# Patient Record
Sex: Female | Born: 1975 | Race: White | Hispanic: No | Marital: Married | Smoking: Never smoker
Health system: Southern US, Community
[De-identification: ages and names within clinical notes are randomized; demographics above are authoritative.]

## PROBLEM LIST (undated history)

## (undated) DIAGNOSIS — T7840XA Allergy, unspecified, initial encounter: Secondary | ICD-10-CM

## (undated) HISTORY — DX: Allergy, unspecified, initial encounter: T78.40XA

## (undated) HISTORY — PX: KNEE SURGERY: SHX244

---

## 2013-03-28 ENCOUNTER — Emergency Department: Payer: Self-pay | Admitting: Emergency Medicine

## 2013-03-28 LAB — URINALYSIS, COMPLETE
Blood: NEGATIVE
Ph: 5 (ref 4.5–8.0)
Protein: NEGATIVE
RBC,UR: 1 /HPF (ref 0–5)
Squamous Epithelial: <1

## 2013-03-28 LAB — COMPREHENSIVE METABOLIC PANEL
Alkaline Phosphatase: 75 U/L (ref 50–136)
BUN: 15 mg/dL (ref 7–18)
Bilirubin,Total: 0.2 mg/dL (ref 0.2–1.0)
Calcium, Total: 9 mg/dL (ref 8.5–10.1)
Chloride: 108 mmol/L — ABNORMAL HIGH (ref 98–107)
Co2: 26 mmol/L (ref 21–32)
Glucose: 87 mg/dL (ref 65–99)
Potassium: 3.5 mmol/L (ref 3.5–5.1)
SGPT (ALT): 32 U/L (ref 12–78)
Sodium: 139 mmol/L (ref 136–145)

## 2013-03-28 LAB — CBC
HCT: 39.7 % (ref 35.0–47.0)
HGB: 13.5 g/dL (ref 12.0–16.0)
MCHC: 34.1 g/dL (ref 32.0–36.0)
MCV: 85 fL (ref 80–100)
Platelet: 277 10*3/uL (ref 150–440)
RBC: 4.68 10*6/uL (ref 3.80–5.20)
WBC: 8.4 10*3/uL (ref 3.6–11.0)

## 2013-03-28 LAB — LIPASE, BLOOD: Lipase: 285 U/L (ref 73–393)

## 2013-04-11 ENCOUNTER — Ambulatory Visit: Payer: Self-pay | Admitting: Gastroenterology

## 2013-04-11 HISTORY — PX: COLONOSCOPY: SHX174

## 2015-01-28 DIAGNOSIS — N23 Unspecified renal colic: Secondary | ICD-10-CM | POA: Insufficient documentation

## 2015-01-28 DIAGNOSIS — Z841 Family history of disorders of kidney and ureter: Secondary | ICD-10-CM | POA: Insufficient documentation

## 2015-01-28 DIAGNOSIS — R310 Gross hematuria: Secondary | ICD-10-CM | POA: Insufficient documentation

## 2017-11-23 ENCOUNTER — Encounter: Payer: Self-pay | Admitting: *Deleted

## 2017-12-19 ENCOUNTER — Ambulatory Visit (INDEPENDENT_AMBULATORY_CARE_PROVIDER_SITE_OTHER): Payer: BLUE CROSS/BLUE SHIELD | Admitting: General Surgery

## 2017-12-19 ENCOUNTER — Encounter: Payer: Self-pay | Admitting: General Surgery

## 2017-12-19 VITALS — BP 124/82 | HR 87 | Resp 18 | Ht 64.0 in | Wt 118.0 lb

## 2017-12-19 DIAGNOSIS — Z800 Family history of malignant neoplasm of digestive organs: Secondary | ICD-10-CM | POA: Diagnosis not present

## 2017-12-19 DIAGNOSIS — Z1211 Encounter for screening for malignant neoplasm of colon: Secondary | ICD-10-CM | POA: Diagnosis not present

## 2017-12-19 MED ORDER — POLYETHYLENE GLYCOL 3350 17 GM/SCOOP PO POWD: 1.0000 | Freq: Once | ORAL | 0 refills | Status: AC

## 2017-12-19 NOTE — Patient Instructions (Addendum)
Colonoscopy, Adult A colonoscopy is an exam to look at the entire large intestine. During the exam, a lubricated, bendable tube is inserted into the anus and then passed into the rectum, colon, and other parts of the large intestine. A colonoscopy is often done as a part of normal colorectal screening or in response to certain symptoms, such as anemia, persistent diarrhea, abdominal pain, and blood in the stool. The exam can help screen for and diagnose medical problems, including:  Tumors.  Polyps.  Inflammation.  Areas of bleeding.  Tell a health care provider about:  Any allergies you have.  All medicines you are taking, including vitamins, herbs, eye drops, creams, and over-the-counter medicines.  Any problems you or family members have had with anesthetic medicines.  Any blood disorders you have.  Any surgeries you have had.  Any medical conditions you have.  Any problems you have had passing stool. What are the risks? Generally, this is a safe procedure. However, problems may occur, including:  Bleeding.  A tear in the intestine.  A reaction to medicines given during the exam.  Infection (rare).  What happens before the procedure? Eating and drinking restrictions Follow instructions from your health care provider about eating and drinking, which may include:  A few days before the procedure - follow a low-fiber diet. Avoid nuts, seeds, dried fruit, raw fruits, and vegetables.  1-3 days before the procedure - follow a clear liquid diet. Drink only clear liquids, such as clear broth or bouillon, black coffee or tea, clear juice, clear soft drinks or sports drinks, gelatin dessert, and popsicles. Avoid any liquids that contain red or purple dye.  On the day of the procedure - do not eat or drink anything during the 2 hours before the procedure, or within the time period that your health care provider recommends.  Bowel prep If you were prescribed an oral bowel prep  to clean out your colon:  Take it as told by your health care provider. Starting the day before your procedure, you will need to drink a large amount of medicated liquid. The liquid will cause you to have multiple loose stools until your stool is almost clear or light green.  If your skin or anus gets irritated from diarrhea, you may use these to relieve the irritation: ? Medicated wipes, such as adult wet wipes with aloe and vitamin E. ? A skin soothing-product like petroleum jelly.  If you vomit while drinking the bowel prep, take a break for up to 60 minutes and then begin the bowel prep again. If vomiting continues and you cannot take the bowel prep without vomiting, call your health care provider.  General instructions  Ask your health care provider about changing or stopping your regular medicines. This is especially important if you are taking diabetes medicines or blood thinners.  Plan to have someone take you home from the hospital or clinic. What happens during the procedure?  An IV tube may be inserted into one of your veins.  You will be given medicine to help you relax (sedative).  To reduce your risk of infection: ? Your health care team will wash or sanitize their hands. ? Your anal area will be washed with soap.  You will be asked to lie on your side with your knees bent.  Your health care provider will lubricate a long, thin, flexible tube. The tube will have a camera and a light on the end.  The tube will be inserted into your   anus.  The tube will be gently eased through your rectum and colon.  Air will be delivered into your colon to keep it open. You may feel some pressure or cramping.  The camera will be used to take images during the procedure.  A small tissue sample may be removed from your body to be examined under a microscope (biopsy). If any potential problems are found, the tissue will be sent to a lab for testing.  If small polyps are found, your  health care provider may remove them and have them checked for cancer cells.  The tube that was inserted into your anus will be slowly removed. The procedure may vary among health care providers and hospitals. What happens after the procedure?  Your blood pressure, heart rate, breathing rate, and blood oxygen level will be monitored until the medicines you were given have worn off.  Do not drive for 24 hours after the exam.  You may have a small amount of blood in your stool.  You may pass gas and have mild abdominal cramping or bloating due to the air that was used to inflate your colon during the exam.  It is up to you to get the results of your procedure. Ask your health care provider, or the department performing the procedure, when your results will be ready. This information is not intended to replace advice given to you by your health care provider. Make sure you discuss any questions you have with your health care provider. Document Released: 08/26/2000 Document Revised: 06/29/2016 Document Reviewed: 11/10/2015 Elsevier Interactive Patient Education  [employer]2018 Elsevier [employer].  The patient is scheduled for a Colonoscopy at [facility] on 02/28/18. They are aware to call the day before to get their arrival time. Miralax prescription has been sent into the patient's pharmacy. The patient is aware of date and instructions.

## 2017-12-19 NOTE — Progress Notes (Signed)
Patient ID: Joanne Delgado, female   DOB: 10/27/1975, 42 y.o.   MRN: [id number]  Chief Complaint  Patient presents with  . Colonoscopy    HPI Joanne Delgado is a 42 y.o. female.  Who presents for a colonoscopy discussion. The last colonoscopy was completed on 2014 by Dr Shelle Iron. Denies any gastrointestinal issues. Bowels move regular and no bleeding noted. She states that she notices a sensitivity to grease causing indigestion. Since she has cut back on intake her symptoms have improved.  She does admit to an awareness in the left upper abdomen/lateral side over the past year. She states her platelets are high and she is concerned. She is concerned about a hiatal hernia as well. CT scan was 2016. 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  HPI  Past Medical History:  Diagnosis Date  . Allergy     Past Surgical History:  Procedure Laterality Date  . CESAREAN SECTION  2014   Twins  . COLONOSCOPY  04/11/2013   Dr Shelle Iron  . KNEE SURGERY Right     Family History  Problem Relation Age of Onset  . Colon cancer Mother 71       [employer] [place]    Social History Social History   Tobacco Use  . Smoking status: Former Smoker    Years: 4.00    Types: Cigarettes  . Smokeless tobacco: Never Used  Substance Use Topics  . Alcohol use: Not Currently  . Drug use: Not Currently    Allergies  Allergen Reactions  . Codeine Nausea And Vomiting    Current Outpatient Medications  Medication Sig Dispense Refill  . norethindrone-ethinyl estradiol 1/35 (ALAYCEN 1/35) tablet TAKE 1 TABLET BY MOUTH EVERY DAY *NO PLACEBO PILLS*    . spironolactone (ALDACTONE) 25 MG tablet Take 25 mg by mouth daily.      No current facility-administered medications for this visit.     Review of Systems Review of Systems  Constitutional: Negative.   Respiratory: Negative.   Cardiovascular: Negative.   Gastrointestinal: Negative for anal bleeding, constipation and diarrhea.    Blood pressure 124/82,  pulse 87, resp. rate 18, height 5\' 4"  (1.626 m), weight 118 lb (53.5 kg), SpO2 99 %.  Physical Exam Physical Exam  Constitutional: She is oriented to person, place, and time. She appears well-developed and well-nourished.  HENT:  Mouth/Throat: Oropharynx is clear and moist.  Eyes: Conjunctivae are normal. No scleral icterus.  Neck: Neck supple.  Cardiovascular: Normal rate, regular rhythm and normal heart sounds.  Pulmonary/Chest: Effort normal and breath sounds normal.  Abdominal: Soft. Normal appearance and bowel sounds are normal. There is no tenderness.  Lymphadenopathy:    She has no cervical adenopathy.  Neurological: She is alert and oriented to person, place, and time.  Skin: Skin is warm and dry.  Psychiatric: Her behavior is normal.    Data Reviewed Upper endoscopy report of April 11, 2013 completed for abdominal pain was reviewed.  Normal esophagus, stomach and duodenum.  Colonoscopy of the same date completed for a family history of colon cancer first-degree relative was reviewed.  Normal colon.  Repeat in 5 years recommended based on family history.  September 25, 2017 exam with the Margit Banda medical aide, MD from [facility] health at [place] reviewed.  Laboratory studies from that visit were reviewed.  Hemoglobin of 13.1 with an MCV of 92, white blood cell count of 9000.  Platelet count elevated at 432,000, ([facility] normal 150-379,000). Comprehensive metabolic  panel was entirely normal.  Creatinine 0.69.  Estimated GFR 108 electrolytes.  Normal.  Assessment    Candidate for screening colonoscopy based on family history (mother at age 43).    Plan    Plan to complete the procedure and sometime after July to meet the patient's travel schedule.  The minimal elevation of the serum platelet count is not of any concern that I am aware of.  We discussed the finding of hiatal hernia and a half of adults.  She has no dysphagia or significant reflux symptoms.  Negative EGD 5 years  ago.  No indication for repeat at this time.  Colonoscopy with possible biopsy/polypectomy prn: Information regarding the procedure, including its potential risks and complications (including but not limited to perforation of the bowel, which may require emergency surgery to repair, and bleeding) was verbally given to the patient. Educational information regarding lower intestinal endoscopy was given to the patient. Written instructions for how to complete the bowel prep using Miralax were provided. The importance of drinking ample fluids to avoid dehydration as a result of the prep emphasized.    HPI, Physical Exam, Assessment and Plan have been scribed under the direction and in the presence of Earline MayotteJeffrey W. Anupama Piehl, MD. Dorathy DaftMarsha Hatch, RN  The patient is scheduled [facility] a [facility]. They are aware to call the day before to get their arrival time. Miralax prescription has been sent into the patient's pharmacy. The patient is aware of date and instructions.  Documented by Caryl-Lyn Louanna RawM Kennedy LPN  Merrily PewJeffrey W Demitrios Molyneux 12/20/2017, 5:46 PM

## 2017-12-20 DIAGNOSIS — Z1211 Encounter for screening for malignant neoplasm of colon: Secondary | ICD-10-CM | POA: Insufficient documentation

## 2017-12-20 DIAGNOSIS — Z800 Family history of malignant neoplasm of digestive organs: Secondary | ICD-10-CM | POA: Insufficient documentation

## 2018-02-04 ENCOUNTER — Other Ambulatory Visit: Payer: Self-pay | Admitting: General Surgery

## 2018-02-04 DIAGNOSIS — Z800 Family history of malignant neoplasm of digestive organs: Secondary | ICD-10-CM

## 2018-02-22 ENCOUNTER — Telehealth: Payer: Self-pay | Admitting: General Surgery

## 2018-02-22 NOTE — Telephone Encounter (Signed)
Patient called and is scheduled for a colonoscopy on Wednesday 02-28-18 & needs someone to go over directions with her,she has lost her's. She did pick up the prep. Call [phone].

## 2018-02-22 NOTE — Telephone Encounter (Signed)
Instructions reviewed with patient

## 2018-02-28 ENCOUNTER — Encounter
Admission: RE | Disposition: A | Discharge status: Home / Self Care | Payer: Self-pay | Source: Ambulatory Visit | Attending: General Surgery

## 2018-02-28 ENCOUNTER — Encounter: Payer: Self-pay | Admitting: Anesthesiology

## 2018-02-28 ENCOUNTER — Ambulatory Visit: Payer: BLUE CROSS/BLUE SHIELD | Admitting: Anesthesiology

## 2018-02-28 ENCOUNTER — Other Ambulatory Visit: Payer: Self-pay

## 2018-02-28 ENCOUNTER — Ambulatory Visit
Admission: RE | Admit: 2018-02-28 | Discharge: 2018-02-28 | Disposition: A | Discharge status: Home / Self Care | Payer: BLUE CROSS/BLUE SHIELD | Source: Ambulatory Visit | Attending: General Surgery | Admitting: General Surgery

## 2018-02-28 DIAGNOSIS — Q438 Other specified congenital malformations of intestine: Secondary | ICD-10-CM | POA: Diagnosis not present

## 2018-02-28 DIAGNOSIS — K621 Rectal polyp: Secondary | ICD-10-CM | POA: Diagnosis not present

## 2018-02-28 DIAGNOSIS — Z87891 Personal history of nicotine dependence: Secondary | ICD-10-CM | POA: Diagnosis not present

## 2018-02-28 DIAGNOSIS — Z79899 Other long term (current) drug therapy: Secondary | ICD-10-CM | POA: Diagnosis not present

## 2018-02-28 DIAGNOSIS — Z800 Family history of malignant neoplasm of digestive organs: Secondary | ICD-10-CM

## 2018-02-28 DIAGNOSIS — Z09 Encounter for follow-up examination after completed treatment for conditions other than malignant neoplasm: Secondary | ICD-10-CM | POA: Diagnosis present

## 2018-02-28 DIAGNOSIS — Z79890 Hormone replacement therapy (postmenopausal): Secondary | ICD-10-CM | POA: Diagnosis not present

## 2018-02-28 HISTORY — PX: COLONOSCOPY WITH PROPOFOL: SHX5780

## 2018-02-28 SURGERY — COLONOSCOPY WITH PROPOFOL
Anesthesia: General

## 2018-02-28 MED ORDER — SODIUM CHLORIDE 0.9 % IV SOLN
INTRAVENOUS | Status: DC
  Administered 2018-02-28 (×2): via INTRAVENOUS

## 2018-02-28 MED ORDER — PROPOFOL 500 MG/50ML IV EMUL
INTRAVENOUS | Status: DC | PRN
  Administered 2018-02-28: 80 ug/kg/min via INTRAVENOUS

## 2018-02-28 MED ORDER — PROPOFOL 500 MG/50ML IV EMUL
INTRAVENOUS | Status: AC
  Filled 2018-02-28: qty 50

## 2018-02-28 MED ORDER — PROPOFOL 10 MG/ML IV BOLUS
INTRAVENOUS | Status: DC | PRN
  Administered 2018-02-28: 20 mg via INTRAVENOUS
  Administered 2018-02-28: 40 mg via INTRAVENOUS
  Administered 2018-02-28: 30 mg via INTRAVENOUS
  Administered 2018-02-28: 20 mg via INTRAVENOUS
  Administered 2018-02-28: 40 mg via INTRAVENOUS
  Administered 2018-02-28: 50 mg via INTRAVENOUS

## 2018-02-28 NOTE — Anesthesia Preprocedure Evaluation (Addendum)
Anesthesia Evaluation  Patient identified by MRN, date of birth, ID band Patient awake    Reviewed: Allergy & Precautions, H&P , NPO status , Patient's Chart, lab work & pertinent test results, reviewed documented beta blocker date and time   History of Anesthesia Complications Negative for: history of anesthetic complications  Airway Mallampati: II  TM Distance: >3 FB Neck ROM: full    Dental  (+) Dental Advidsory Given, Teeth Intact   Pulmonary neg pulmonary ROS, former smoker,           Cardiovascular Exercise Tolerance: Good negative cardio ROS       Neuro/Psych negative neurological ROS  negative psych ROS   GI/Hepatic negative GI ROS, Neg liver ROS,   Endo/Other  negative endocrine ROS  Renal/GU negative Renal ROS  negative genitourinary   Musculoskeletal   Abdominal   Peds  Hematology negative hematology ROS (+)   Anesthesia Other Findings Past Medical History: No date: Allergy   Reproductive/Obstetrics negative OB ROS                            Anesthesia Physical Anesthesia Plan  ASA: I  Anesthesia Plan: General   Post-op Pain Management:    Induction: Intravenous  PONV Risk Score and Plan: 3 and Propofol infusion  Airway Management Planned: Nasal Cannula  Additional Equipment:   Intra-op Plan:   Post-operative Plan:   Informed Consent: I have reviewed the patients History and Physical, chart, labs and discussed the procedure including the risks, benefits and alternatives for the proposed anesthesia with the patient or authorized representative who has indicated his/her understanding and acceptance.   Dental Advisory Given  Plan Discussed with: Anesthesiologist, CRNA and Surgeon  Anesthesia Plan Comments:         Anesthesia Quick Evaluation

## 2018-02-28 NOTE — Op Note (Signed)
[facility] Patient Name: Joanne Delgado Procedure Date: 02/28/2018 8:56 AM MRN: [id number] Account #: [id number] Date of Birth: 18-Sep-1975 Admit Type: Outpatient Age: [age] Room: [facility] ENDO ROOM 1 Gender: Female Note Status: Finalized Procedure:            Colonoscopy Indications:          Family history of colon cancer in a first-degree                        relative Providers:            Earline MayotteJeffrey W. Codie Hainer, MD Referring MD:         Carlisle BeersJohn L. Marva PandaUlmer, MD (Referring MD), Fonda KinderBenjamin W.                        Mcclintock (Referring MD) Medicines:            Monitored Anesthesia Care Complications:        No immediate complications. Procedure:            Pre-Anesthesia Assessment:                       - Prior to the procedure, a History and Physical was                        performed, and patient medications, allergies and                        sensitivities were reviewed. The patient's tolerance of                        previous anesthesia was reviewed.                       - The risks and benefits of the procedure and the                        sedation options and risks were discussed with the                        patient. All questions were answered and informed                        consent was obtained.                       After obtaining informed consent, the colonoscope was                        passed under direct vision. Throughout the procedure,                        the patient's blood pressure, pulse, and oxygen                        saturations were monitored continuously. The                        Colonoscope was introduced through the anus and  advanced to the the cecum, identified by appendiceal                        orifice and ileocecal valve. The colonoscopy was                        somewhat difficult due to significant looping.                        Successful completion of the procedure was aided  by                        using manual pressure. The patient tolerated the                        procedure well. The quality of the bowel preparation                        was excellent. Findings:      A 5 mm polyp was found in the rectum. The polyp was sessile. This was       biopsied with a cold forceps for histology.      The retroflexed view of the distal rectum and anal verge was normal and       showed no anal or rectal abnormalities. Impression:           - One 5 mm polyp in the rectum. Biopsied.                       - The distal rectum and anal verge are normal on                        retroflexion view. Recommendation:       - Repeat colonoscopy in 5 years for surveillance.                       - Telephone endoscopist for pathology results in 1 week. Procedure Code(s):    --- Professional ---                       [id number], Colonoscopy, flexible; with biopsy, single or                        multiple Diagnosis Code(s):    --- Professional ---                       Z80.0, Family history of malignant neoplasm of                        digestive organs                       K62.1, Rectal polyp CPT copyright 2017 American Medical Association. All rights reserved. The codes documented in this report are preliminary and upon coder review may  be revised to meet current compliance requirements. Earline Mayotte, MD 02/28/2018 9:35:17 AM This report has been signed electronically. Number of Addenda: 0 Note Initiated On: 02/28/2018 8:56 AM Scope Withdrawal Time: 0 hours 6 minutes 58 seconds  Total Procedure Duration: 0 hours 17 minutes 27 seconds       [facility]

## 2018-02-28 NOTE — Transfer of Care (Signed)
Immediate Anesthesia Transfer of Care Note  Patient: Joanne Delgado  Procedure(s) Performed: COLONOSCOPY WITH PROPOFOL (N/A )  Patient Location: PACU  Anesthesia Type:General  Level of Consciousness: awake  Airway & Oxygen Therapy: Patient Spontanous Breathing  Post-op Assessment: Report given to RN  Post vital signs: stable  Last Vitals:  Vitals Value Taken Time  BP    Temp 36.6 C 02/28/2018  9:35 AM  Pulse    Resp    SpO2      Last Pain:  Vitals:   02/28/18 0935  TempSrc: Tympanic  PainSc:          Complications: No apparent anesthesia complications

## 2018-02-28 NOTE — H&P (Signed)
Joanne Delgado [id number] 1976/05/11     HPI:  Family history of colon cancer below age 43. For follow up colonoscopy. Tolerated prep well.   Medications Prior to Admission  Medication Sig Dispense Refill Last Dose  . norethindrone-ethinyl estradiol 1/35 (ALAYCEN 1/35) tablet TAKE 1 TABLET BY MOUTH EVERY DAY *NO PLACEBO PILLS*   Past Week at Unknown time  . spironolactone (ALDACTONE) 25 MG tablet Take 25 mg by mouth daily.    Past Week at Unknown time   Allergies  Allergen Reactions  . Codeine Nausea And Vomiting   Past Medical History:  Diagnosis Date  . Allergy    Past Surgical History:  Procedure Laterality Date  . CESAREAN SECTION  2014   Twins  . COLONOSCOPY  04/11/2013   Dr Shelle Ironein  . KNEE SURGERY Right    Social History   Socioeconomic History  . Marital status: Married    Spouse name: Not on file  . Number of children: Not on file  . Years of education: Not on file  . Highest education level: Not on file  Occupational History  . Not on file  Social Needs  . Financial resource strain: Not on file  . Food insecurity:    Worry: Not on file    Inability: Not on file  . Transportation needs:    Medical: Not on file    Non-medical: Not on file  Tobacco Use  . Smoking status: Never Smoker  . Smokeless tobacco: Never Used  Substance and Sexual Activity  . Alcohol use: Never    Frequency: Never  . Drug use: Never  . Sexual activity: Yes  Lifestyle  . Physical activity:    Days per week: Not on file    Minutes per session: Not on file  . Stress: Not on file  Relationships  . Social connections:    Talks on phone: Not on file    Gets together: Not on file    Attends religious service: Not on file    Active member of club or organization: Not on file    Attends meetings of clubs or organizations: Not on file    Relationship status: Not on file  . Intimate partner violence:    Fear of current or ex partner: Not on file    Emotionally abused: Not on file    Physically abused: Not on file    Forced sexual activity: Not on file  Other Topics Concern  . Not on file  Social History Narrative  . Not on file   Social History   Social History Narrative  . Not on file     ROS: Negative.     PE: HEENT: Negative. Lungs: Clear. Cardio: RR.  Assessment/Plan:  Proceed with planned endoscopy.  Merrily PewJeffrey W [facility] 02/28/2018

## 2018-02-28 NOTE — Anesthesia Postprocedure Evaluation (Signed)
Anesthesia Post Note  Patient: Joanne Delgado  Procedure(s) Performed: COLONOSCOPY WITH PROPOFOL (N/A )  Patient location during evaluation: Endoscopy Anesthesia Type: General Level of consciousness: awake and alert Pain management: pain level controlled Vital Signs Assessment: post-procedure vital signs reviewed and stable Respiratory status: spontaneous breathing, nonlabored ventilation, respiratory function stable and patient connected to nasal cannula oxygen Cardiovascular status: blood pressure returned to baseline and stable Postop Assessment: no apparent nausea or vomiting Anesthetic complications: no     Last Vitals:  Vitals:   02/28/18 0947 02/28/18 0959  BP: 106/75 100/76  Pulse: 80 72  Resp: 20 14  Temp:    SpO2: 100% 100%    Last Pain:  Vitals:   02/28/18 0959  TempSrc:   PainSc: 0-No pain                 Lenard SimmerAndrew Roddrick Sharron

## 2018-02-28 NOTE — Anesthesia Post-op Follow-up Note (Signed)
Anesthesia QCDR form completed.        

## 2018-03-01 LAB — SURGICAL PATHOLOGY

## 2018-03-02 ENCOUNTER — Telehealth: Payer: Self-pay

## 2018-03-02 NOTE — Telephone Encounter (Signed)
-----   Message from Earline MayotteJeffrey W Byrnett, MD sent at 03/02/2018  9:13 AM EDT ----- Please notify the patient that the polyp removed at the time of her recent colonoscopy was fine.  She should plan on repeat exam in 5 years based on her family history. ----- Message ----- From: Interface, Lab In Three Zero One Sent: 03/01/2018   7:01 PM To: Earline MayotteJeffrey W Byrnett, MD

## 2018-03-05 NOTE — Telephone Encounter (Signed)
Notified patient as instructed, patient pleased. Discussed follow-up appointments, patient agrees. Patient placed in recalls.   

## 2019-02-21 ENCOUNTER — Other Ambulatory Visit: Payer: Self-pay | Admitting: Student

## 2019-02-21 DIAGNOSIS — L709 Acne, unspecified: Secondary | ICD-10-CM | POA: Insufficient documentation

## 2019-02-21 DIAGNOSIS — R197 Diarrhea, unspecified: Secondary | ICD-10-CM

## 2019-02-21 DIAGNOSIS — R109 Unspecified abdominal pain: Secondary | ICD-10-CM

## 2019-03-01 ENCOUNTER — Other Ambulatory Visit: Payer: Self-pay

## 2019-03-01 ENCOUNTER — Encounter
Admission: RE | Admit: 2019-03-01 | Discharge: 2019-03-01 | Disposition: A | Discharge status: Home / Self Care | Payer: BC Managed Care – PPO | Source: Ambulatory Visit | Attending: Student | Admitting: Student

## 2019-03-01 DIAGNOSIS — R109 Unspecified abdominal pain: Secondary | ICD-10-CM | POA: Diagnosis present

## 2019-03-01 DIAGNOSIS — R197 Diarrhea, unspecified: Secondary | ICD-10-CM | POA: Insufficient documentation

## 2019-03-01 MED ORDER — SINCALIDE 5 MCG IJ SOLR
0.0200 ug/kg | Freq: Once | INTRAMUSCULAR | Status: AC
  Administered 2019-03-01: 1 ug via INTRAVENOUS

## 2019-03-01 MED ORDER — TECHNETIUM TC 99M MEBROFENIN IV KIT
5.0000 | PACK | Freq: Once | INTRAVENOUS | Status: AC | PRN
  Administered 2019-03-01: 5.763 via INTRAVENOUS

## 2019-03-07 ENCOUNTER — Encounter: Payer: Self-pay | Admitting: General Surgery

## 2019-03-07 ENCOUNTER — Other Ambulatory Visit: Payer: Self-pay

## 2019-03-07 ENCOUNTER — Ambulatory Visit: Payer: BC Managed Care – PPO | Admitting: General Surgery

## 2019-03-07 VITALS — BP 114/79 | HR 77 | Temp 97.7°F | Resp 14 | Ht 64.0 in | Wt 124.0 lb

## 2019-03-07 DIAGNOSIS — G8929 Other chronic pain: Secondary | ICD-10-CM | POA: Diagnosis not present

## 2019-03-07 DIAGNOSIS — R1010 Upper abdominal pain, unspecified: Secondary | ICD-10-CM | POA: Diagnosis not present

## 2019-03-07 DIAGNOSIS — R1013 Epigastric pain: Secondary | ICD-10-CM | POA: Diagnosis not present

## 2019-03-07 NOTE — Progress Notes (Signed)
Patient ID: Joanne Delgado, female   DOB: 1975-12-14, 43 y.o.   MRN: [id number]  Chief Complaint  Patient presents with  . Follow-up    Gallbladder-discuss HIDA scan    HPI Joanne Delgado is a 43 y.o. female.  Here today to discuss recent HIDA scan.  The patient was seen at the request of Tammi Klippel, PA from the gastroenterology department of the [facility].  She reports a 6-8-week history of generalized awareness of abdominal discomfort, "upset stomach" rather than nausea primarily in the upper quadrants with extension occasionally into the mid back.  She has a significant family history of gallbladder disease.  Her mother had colon cancer at age 70.  The patient underwent a screening colonoscopy in June 2019 that was remarkable for a single hyperplastic polyp with a repeat exam scheduled for 2024.  Institution of PPI has not produced any significant change in her symptoms.  During her GI evaluation she did have testing for H. pylori which was negative as well as testing for gluten sensitivity which was negative.  She underwent an abdominal ultrasound (written report only in the referring notes) from a hospital in [place] where the gallbladder was noted to be normal with no gallbladder wall thickening or pericholecystic fluid.  Common bile duct 2.1 mm.  Ultrasound in July 2014 for epigastric pain was negative.  Of note, prior to the onset of her vague abdominal discomfort she had had an episode of fulminant diarrhea that lasted about 36 hours without prodrome.  No subsequent issues or events.  HPI  Past Medical History:  Diagnosis Date  . Allergy     Past Surgical History:  Procedure Laterality Date  . CESAREAN SECTION  2014   [facility]  . COLONOSCOPY  04/11/2013   Dr Rayann Heman  . COLONOSCOPY WITH PROPOFOL N/A 02/28/2018   Procedure: COLONOSCOPY WITH PROPOFOL;  Surgeon: Robert Bellow, MD;  Location: [facility] ENDOSCOPY;  Service: Endoscopy;  Laterality: N/A;  . KNEE SURGERY  Right     Family History  Problem Relation Age of Onset  . Colon cancer Mother 19       Riki Rusk    Social History Social History   Tobacco Use  . Smoking status: Never Smoker  . Smokeless tobacco: Never Used  Substance Use Topics  . Alcohol use: Never    Frequency: Never  . Drug use: Never    Allergies  Allergen Reactions  . Codeine Nausea And Vomiting    Current Outpatient Medications  Medication Sig Dispense Refill  . norethindrone-ethinyl estradiol 1/35 (ALAYCEN 1/35) tablet TAKE 1 TABLET BY MOUTH EVERY DAY *NO PLACEBO PILLS*    . spironolactone (ALDACTONE) 25 MG tablet Take 25 mg by mouth daily.      No current facility-administered medications for this visit.     Review of Systems Review of Systems  Constitutional: Negative.   Respiratory: Negative.   Cardiovascular: Negative.     Blood pressure 114/79, pulse 77, temperature 97.7 F (36.5 C), temperature source Temporal, resp. rate 14, height 5\' 4"  (1.626 m), weight 124 lb (56.2 kg), last menstrual period 02/15/2019.  Physical Exam Physical Exam Exam conducted with a chaperone present.  Constitutional:      Appearance: Normal appearance. She is well-developed.  Eyes:     General: No scleral icterus.    Conjunctiva/sclera: Conjunctivae normal.  Neck:     Musculoskeletal: Neck supple.  Cardiovascular:     Rate and Rhythm: Normal rate and regular rhythm.  Heart sounds: Normal heart sounds.  Pulmonary:     Effort: Pulmonary effort is normal.     Breath sounds: Normal breath sounds.  Abdominal:     General: Abdomen is flat. Bowel sounds are normal. There is no distension or abdominal bruit.     Palpations: Abdomen is soft. There is no hepatomegaly or splenomegaly.     Tenderness: There is no abdominal tenderness.    Musculoskeletal:     Right lower leg: No edema.     Left lower leg: No edema.  Lymphadenopathy:     Cervical: No cervical adenopathy.     Upper Body:     Left upper body: No  supraclavicular adenopathy.     Lower Body: No right inguinal adenopathy. No left inguinal adenopathy.  Skin:    General: Skin is warm and dry.  Neurological:     Mental Status: She is alert and oriented to person, place, and time.  Psychiatric:        Behavior: Behavior normal.     Data Reviewed See above.  The patient reports laboratory studies were completed with her PCP and she will have them faxed to the office.  By report, normal.  The patient underwent a HIDA scan with CCK on March 01, 2019 and these images were independently reviewed.  The patient reported minimal awareness of the CCK injection without true reproduction of her symptoms.     Assessment Atypical abdominal pain.  Plan  In spite of her family history of biliary disease, she has had multiple negative ultrasounds and while her HIDA scan shows a low ejection fraction of 50%, there is no clear dietary correlation between the discomfort she is experiencing in any particular foods.  With the unusual episode of 36 hours of explosive diarrhea (some of the stool floating, some sinking, no particular notice a foul odor) and a return to normal bowel function since that time it may simply be a recovery phase from a bad bout of colitis, now resolved.  She will have her laboratory studies sent down, so they are available for review here.  If her symptoms persist, will consider cholecystectomy based on her CCK response, but it was made clear that the lack of correlation between her symptoms and her dietary intake puts her chance of successful resolution of her discomfort at about 50-50.  The risks of biliary surgery was reviewed.  The patient is aware to call back for any questions or new concerns. Call in one month with status report, earlier if she appreciates crescendo symptoms.   HPI, Physical Exam, Assessment and Plan have been scribed under the direction and in the presence of Earline MayotteJeffrey W. Byrnett, MD. Arn MedalSheila Childers,  CMA HPI, assessment, plan and physical exam has been scribed under the direction and in the presence of Earline MayotteJeffrey W. Byrnett, MD. Dorathy DaftMarsha Hatch, RN  I have completed the exam and reviewed the above documentation for accuracy and completeness.  I agree with the above.  [employer] has been used and any errors in dictation or transcription are unintentional.  Donnalee CurryJeffrey Byrnett, M.D., F.A.C.S.  Merrily PewJeffrey W Byrnett 03/08/2019, 5:56 PM

## 2019-03-07 NOTE — Patient Instructions (Addendum)
The patient is aware to call back for any questions or new concerns. Call in one month with a status report

## 2019-03-08 ENCOUNTER — Encounter: Payer: Self-pay | Admitting: General Surgery

## 2019-03-08 DIAGNOSIS — G8929 Other chronic pain: Secondary | ICD-10-CM | POA: Insufficient documentation

## 2021-03-10 IMAGING — NM NUCLEAR MEDICINE HEPATOBILIARY IMAGING WITH GALLBLADDER EF
2 series · 12 of 12 positions shown · non-contrast
Comparison: None

CLINICAL DATA: Episodic severe abdominal discomfort with acute
diarrhea 3 weeks ago

EXAM:
NUCLEAR MEDICINE HEPATOBILIARY IMAGING WITH GALLBLADDER EF
TECHNIQUE: Sequential images of the abdomen were obtained [DATE] minutes
following intravenous administration of radiopharmaceutical. After
slow intravenous infusion of 1.0 micrograms Cholecystokinin,
gallbladder ejection fraction was determined.
RADIOPHARMACEUTICALS:  5.763 mCi Wc-ZZm Choletec IV

[Series 1000: gallbladder ef · 4.80mm/px · 6 of 120 frames shown]
[frame 11/120]
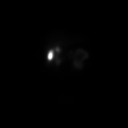
[frame 31/120]
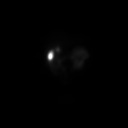
[frame 51/120]
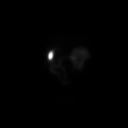
[frame 71/120]
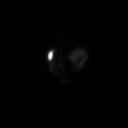
[frame 91/120]
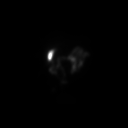
[frame 111/120]
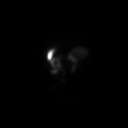

[Series 1000: hepatobiliary scan · 9.59mm/px · 6 of 60 frames shown]
[frame 6/60]
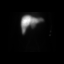
[frame 16/60]
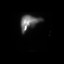
[frame 26/60]
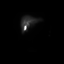
[frame 36/60]
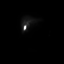
[frame 46/60]
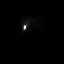
[frame 56/60]
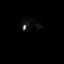

[12 of 12 positions shown; findings below may reference images not displayed]

FINDINGS: Normal tracer extraction from bloodstream indicating normal
hepatocellular function.

Normal excretion of tracer into biliary tree.

Gallbladder visualized at 15 min.

Small bowel visualized at 36 min.

No hepatic retention of tracer.

Subjectively only mild emptying of tracer from gallbladder following
CCK administration.

Calculated gallbladder ejection fraction is 15%, abnormally low.

Patient reported no symptoms following CCK administration.

Normal gallbladder ejection fraction following CCK stimulation is
greater than 40% at 1 hour.
IMPRESSION: Patent biliary tree.

Decreased gallbladder ejection fraction of 15% following CCK
stimulation.

## 2023-01-10 ENCOUNTER — Encounter: Payer: Self-pay | Admitting: Surgery

## 2023-01-11 ENCOUNTER — Encounter
Admission: RE | Disposition: A | Discharge status: Home / Self Care | Payer: Self-pay | Source: Home / Self Care | Attending: Surgery

## 2023-01-11 ENCOUNTER — Ambulatory Visit: Payer: BC Managed Care – PPO | Admitting: Anesthesiology

## 2023-01-11 ENCOUNTER — Encounter: Payer: Self-pay | Admitting: Surgery

## 2023-01-11 ENCOUNTER — Ambulatory Visit
Admission: RE | Admit: 2023-01-11 | Discharge: 2023-01-11 | Disposition: A | Discharge status: Home / Self Care | Payer: BC Managed Care – PPO | Attending: Surgery | Admitting: Surgery

## 2023-01-11 DIAGNOSIS — K640 First degree hemorrhoids: Secondary | ICD-10-CM | POA: Diagnosis not present

## 2023-01-11 DIAGNOSIS — Z800 Family history of malignant neoplasm of digestive organs: Secondary | ICD-10-CM | POA: Diagnosis not present

## 2023-01-11 DIAGNOSIS — Z1211 Encounter for screening for malignant neoplasm of colon: Secondary | ICD-10-CM | POA: Diagnosis present

## 2023-01-11 HISTORY — PX: COLONOSCOPY WITH PROPOFOL: SHX5780

## 2023-01-11 LAB — POCT PREGNANCY, URINE: Preg Test, Ur: NEGATIVE

## 2023-01-11 SURGERY — COLONOSCOPY WITH PROPOFOL
Anesthesia: General

## 2023-01-11 MED ORDER — LIDOCAINE HCL (CARDIAC) PF 100 MG/5ML IV SOSY
PREFILLED_SYRINGE | INTRAVENOUS | Status: DC | PRN
  Administered 2023-01-11: 100 mg via INTRAVENOUS

## 2023-01-11 MED ORDER — PROPOFOL 10 MG/ML IV BOLUS
INTRAVENOUS | Status: DC | PRN
  Administered 2023-01-11: 120 mg via INTRAVENOUS
  Administered 2023-01-11: 190 ug/kg/min via INTRAVENOUS

## 2023-01-11 MED ORDER — LIDOCAINE HCL (PF) 2 % IJ SOLN
INTRAMUSCULAR | Status: AC
  Filled 2023-01-11: qty 5

## 2023-01-11 MED ORDER — PROPOFOL 10 MG/ML IV BOLUS
INTRAVENOUS | Status: AC
  Filled 2023-01-11: qty 40

## 2023-01-11 MED ORDER — SODIUM CHLORIDE 0.9 % IV SOLN: INTRAVENOUS | Status: DC

## 2023-01-11 NOTE — Interval H&P Note (Signed)
History and Physical Interval Note:  01/11/2023 8:19 AM  Joanne Delgado  has presented today for surgery, with the diagnosis of family history of colon cancer Z80.0.  The various methods of treatment have been discussed with the patient and family. After consideration of risks, benefits and other options for treatment, the patient has consented to  Procedure(s): COLONOSCOPY WITH PROPOFOL (N/A) as a surgical intervention.  The patient's history has been reviewed, patient examined, no change in status, stable for surgery.  I have reviewed the patient's chart and labs.  Questions were answered to the patient's satisfaction.     Bassel Gaskill Tonna Boehringer

## 2023-01-11 NOTE — Transfer of Care (Signed)
Immediate Anesthesia Transfer of Care Note  Patient: Joanne Delgado  Procedure(s) Performed: COLONOSCOPY WITH PROPOFOL  Patient Location: Endoscopy Unit  Anesthesia Type:General  Level of Consciousness: drowsy  Airway & Oxygen Therapy: Patient Spontanous Breathing  Post-op Assessment: Report given to RN and Post -op Vital signs reviewed and stable  Post vital signs: Reviewed and stable  Last Vitals:  Vitals Value Taken Time  BP 97/66 01/11/23 0848  Temp 35.8 0848  Pulse 72 0848  Resp 18 0848  SpO2 97% 0848  Vitals shown include unvalidated device data.  Last Pain:  Vitals:   01/11/23 0756  TempSrc: Temporal  PainSc: 0-No pain         Complications: No notable events documented.

## 2023-01-11 NOTE — Op Note (Signed)
[facility] Gastroenterology Patient Name: Joanne Delgado Procedure Date: 01/11/2023 8:02 AM MRN: [id number] Account #: [id number] Date of Birth: 1976-03-14 Admit Type: Outpatient Age: 47 Room: [facility] ENDO ROOM 1 Gender: Female Note Status: Finalized Instrument Name: Peds Colonoscope [id number] Procedure:             Colonoscopy Indications:           Screening in patient at increased risk: Family history                         of 1st-degree relative with colorectal cancer Providers:             Sung Amabile MD, MD Medicines:             Propofol per Anesthesia Complications:         No immediate complications. Procedure:             Pre-Anesthesia Assessment:                        - After reviewing the risks and benefits, the patient                         was deemed in satisfactory condition to undergo the                         procedure in an ambulatory setting.                        After obtaining informed consent, the colonoscope was                         passed under direct vision. Throughout the procedure,                         the patient's blood pressure, pulse, and oxygen                         saturations were monitored continuously. The                         Colonoscope was introduced through the anus and                         advanced to the the cecum, identified by the ileocecal                         valve. The colonoscopy was performed with ease. The                         patient tolerated the procedure well. The quality of                         the bowel preparation was good. Findings:      The perianal and digital rectal examinations were normal.      Non-bleeding internal hemorrhoids were found during retroflexion. The       hemorrhoids were Grade I (internal hemorrhoids that do not prolapse).      The exam was otherwise without abnormality. Impression:            -  Non-bleeding internal hemorrhoids.                        - The  examination was otherwise normal.                        - No specimens collected. Recommendation:        - Written discharge instructions were provided to the                         patient.                        - Discharge patient to home.                        - Resume previous diet. Procedure Code(s):     --- Professional ---                        [phone], Colonoscopy, flexible; diagnostic, including                         collection of specimen(s) by brushing or washing, when                         performed (separate procedure) Diagnosis Code(s):     --- Professional ---                        Z80.0, Family history of malignant neoplasm of                         digestive organs                        K64.0, First degree hemorrhoids CPT copyright 2022 American Medical Association. All rights reserved. The codes documented in this report are preliminary and upon coder review may  be revised to meet current compliance requirements. Dr. Harrie Foreman, MD Sung Amabile MD, MD 01/11/2023 8:45:58 AM This report has been signed electronically. Number of Addenda: 0 Note Initiated On: 01/11/2023 8:02 AM Scope Withdrawal Time: 0 hours 4 minutes 11 seconds  Total Procedure Duration: 0 hours 9 minutes 7 seconds  Estimated Blood Loss:  Estimated blood loss: none.      [facility]

## 2023-01-11 NOTE — Anesthesia Preprocedure Evaluation (Signed)
Anesthesia Evaluation  Patient identified by MRN, date of birth, ID band Patient awake    Reviewed: Allergy & Precautions, NPO status , Patient's Chart, lab work & pertinent test results  History of Anesthesia Complications Negative for: history of anesthetic complications  Airway Mallampati: III  TM Distance: >3 FB Neck ROM: full    Dental  (+) Chipped   Pulmonary neg pulmonary ROS, neg shortness of breath   Pulmonary exam normal        Cardiovascular Exercise Tolerance: Good (-) angina negative cardio ROS Normal cardiovascular exam     Neuro/Psych negative neurological ROS  negative psych ROS   GI/Hepatic negative GI ROS, Neg liver ROS,neg GERD  ,,  Endo/Other  negative endocrine ROS    Renal/GU negative Renal ROS  negative genitourinary   Musculoskeletal   Abdominal   Peds  Hematology negative hematology ROS (+)   Anesthesia Other Findings Past Medical History: No date: Allergy  Past Surgical History: 2014: CESAREAN SECTION     Comment:  [facility]ins 04/11/2013: COLONOSCOPY     Comment:  Dr Shelle Iron 02/28/2018: COLONOSCOPY WITH PROPOFOL; N/A     Comment:  Procedure: COLONOSCOPY WITH PROPOFOL;  Surgeon: Earline Mayotte, MD;  Location: [facility] ENDOSCOPY;  Service:               Endoscopy;  Laterality: N/A; No date: KNEE SURGERY; Right  BMI    Body Mass Index: 21.49 kg/m      Reproductive/Obstetrics negative OB ROS                             Anesthesia Physical Anesthesia Plan  ASA: 2  Anesthesia Plan: General   Post-op Pain Management:    Induction: Intravenous  PONV Risk Score and Plan: Propofol infusion and TIVA  Airway Management Planned: Natural Airway and Nasal Cannula  Additional Equipment:   Intra-op Plan:   Post-operative Plan:   Informed Consent: I have reviewed the patients History and Physical, chart, labs and discussed the procedure including  the risks, benefits and alternatives for the proposed anesthesia with the patient or authorized representative who has indicated his/her understanding and acceptance.     Dental Advisory Given  Plan Discussed with: Anesthesiologist, CRNA and Surgeon  Anesthesia Plan Comments: (Patient consented for risks of anesthesia including but not limited to:  - adverse reactions to medications - risk of airway placement if required - damage to eyes, teeth, lips or other oral mucosa - nerve damage due to positioning  - sore throat or hoarseness - Damage to heart, brain, nerves, lungs, other parts of body or loss of life  Patient voiced understanding.)       Anesthesia Quick Evaluation

## 2023-01-11 NOTE — Anesthesia Postprocedure Evaluation (Signed)
Anesthesia Post Note  Patient: Joanne Delgado  Procedure(s) Performed: COLONOSCOPY WITH PROPOFOL  Patient location during evaluation: Endoscopy Anesthesia Type: General Level of consciousness: awake and alert Pain management: pain level controlled Vital Signs Assessment: post-procedure vital signs reviewed and stable Respiratory status: spontaneous breathing, nonlabored ventilation, respiratory function stable and patient connected to nasal cannula oxygen Cardiovascular status: blood pressure returned to baseline and stable Postop Assessment: no apparent nausea or vomiting Anesthetic complications: no   No notable events documented.   Last Vitals:  Vitals:   01/11/23 0858 01/11/23 0908  BP: 108/79 111/79  Pulse:    Resp:    Temp:    SpO2:      Last Pain:  Vitals:   01/11/23 0908  TempSrc:   PainSc: 0-No pain                 Cleda Mccreedy Erhardt Dada

## 2023-01-11 NOTE — H&P (Signed)
Subjective:   CC: Family history of colon cancer [Z80.0]  HPI: Joanne Delgado is a 47 y.o. female who presents for evaluation of above. Due for her next colonoscopy this year.  reports Symptoms of visible "flutter" in LUQ at skin level. Started about a week ago. Alleviated by nothing specific. Associated with nothing.  Past Medical History: GERD-stable  Past Surgical History: has a past surgical history that includes Cesarean section (2014).  Family History: family history includes Colon cancer (age of onset: 58) in her mother.  Social History: reports that she has never smoked. She has never used smokeless tobacco. She reports that she does not currently use alcohol. She reports that she does not use drugs.  Current Medications: has a current medication list which includes the following prescription(s): cyanocobalamin, multivitamin, norethindrone-ethinyl estradiol, and spironolactone.  Allergies:  Allergies  Allergen Reactions  Codeine Vomiting and Nausea And Vomiting   ROS:  A 15 point review of systems was performed and pertinent positives and negatives noted in HPI  Objective:    BP 119/76  Pulse 85  Ht 162.6 cm (5\' 4" )  Wt 56.7 kg (125 lb)  BMI 21.46 kg/m   Constitutional : No distress, cooperative, alert  Lymphatics/Throat: Supple with no lymphadenopathy  Respiratory: Clear to auscultation bilaterally  Cardiovascular: Regular rate and rhythm  Gastrointestinal: Soft, non-tender, non-distended, no organomegaly.  Musculoskeletal: Steady gait and movement  Skin: Cool and moist  Psychiatric: Normal affect, non-agitated, not confused     LABS:  N/a   RADS: N/a  Assessment:    Family history of colon cancer [Z80.0]  Plan:    1. Family history of colon cancer [Z80.0] R/b/a discussed. Risks include bleeding, perforation. Benefits include diagnostic, curative procedure if needed. Alternatives include continued observation. Pt verbalized  understanding.  Procedure will be scheduled.  labs/images/medications/previous chart entries reviewed personally and relevant changes/updates noted above.

## 2023-01-12 ENCOUNTER — Encounter: Payer: Self-pay | Admitting: Surgery

## 2023-01-18 ENCOUNTER — Encounter: Payer: Self-pay | Admitting: Surgery
